# Patient Record
Sex: Male | Born: 1990 | Race: White | Hispanic: No | Marital: Married | Smoking: Never smoker
Health system: Southern US, Community
[De-identification: ages and names within clinical notes are randomized; demographics above are authoritative.]

## PROBLEM LIST (undated history)

## (undated) HISTORY — PX: NEPHRECTOMY TRANSPLANTED ORGAN: SUR880

---
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
## 2008-02-02 ENCOUNTER — Other Ambulatory Visit: Payer: Self-pay

## 2008-02-02 ENCOUNTER — Inpatient Hospital Stay: Payer: Self-pay | Admitting: Specialist

## 2008-02-09 ENCOUNTER — Ambulatory Visit: Payer: Self-pay | Admitting: *Deleted

## 2008-02-15 ENCOUNTER — Ambulatory Visit: Payer: Self-pay

## 2008-04-03 ENCOUNTER — Ambulatory Visit: Payer: Self-pay | Admitting: Vascular Surgery

## 2015-02-09 ENCOUNTER — Emergency Department: Payer: Worker's Compensation

## 2015-02-09 ENCOUNTER — Emergency Department
Admission: EM | Admit: 2015-02-09 | Discharge: 2015-02-09 | Disposition: A | Discharge status: Home / Self Care | Payer: Worker's Compensation | Attending: Emergency Medicine | Admitting: Emergency Medicine

## 2015-02-09 ENCOUNTER — Encounter: Payer: Self-pay | Admitting: *Deleted

## 2015-02-09 DIAGNOSIS — Y9389 Activity, other specified: Secondary | ICD-10-CM | POA: Insufficient documentation

## 2015-02-09 DIAGNOSIS — Y990 Civilian activity done for income or pay: Secondary | ICD-10-CM | POA: Insufficient documentation

## 2015-02-09 DIAGNOSIS — Y9289 Other specified places as the place of occurrence of the external cause: Secondary | ICD-10-CM | POA: Diagnosis not present

## 2015-02-09 DIAGNOSIS — S0990XA Unspecified injury of head, initial encounter: Secondary | ICD-10-CM

## 2015-02-09 MED ORDER — TRAMADOL HCL 50 MG PO TABS: 50.0000 mg | ORAL_TABLET | Freq: Four times a day (QID) | ORAL | Status: DC | PRN
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
## 2015-10-16 ENCOUNTER — Encounter: Payer: Self-pay | Admitting: Physician Assistant

## 2015-10-16 ENCOUNTER — Ambulatory Visit: Payer: Self-pay | Admitting: Physician Assistant

## 2015-10-16 VITALS — BP 120/60 | HR 98 | Temp 98.3°F

## 2015-10-16 DIAGNOSIS — J018 Other acute sinusitis: Secondary | ICD-10-CM

## 2015-10-16 MED ORDER — AMOXICILLIN 875 MG PO TABS: 875.0000 mg | ORAL_TABLET | Freq: Two times a day (BID) | ORAL | Status: DC

## 2015-10-16 NOTE — Progress Notes (Signed)
S: C/o runny nose and congestion for 3 days, no fever, chills, cp/sob, v/d; mucus is green and thick, cough is sporadic, c/o of facial and dental pain. Some ear pain, using xyzal and nasonex  Using otc meds:   O: PE: vitals wnl, nad, perrl eomi, normocephalic, tms dull, nasal mucosa red and swollen, throat injected, neck supple no lymph, lungs c t a, cv rrr, neuro intact  A:  Acute sinusitis   P: amoxil 875mg  bid, drink fluids, continue regular meds , use otc meds of choice, return if not improving in 5 days, return earlier if worsening
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
## 2015-12-14 ENCOUNTER — Other Ambulatory Visit: Payer: Self-pay

## 2015-12-14 ENCOUNTER — Other Ambulatory Visit: Payer: Self-pay | Admitting: Physician Assistant

## 2015-12-14 NOTE — Progress Notes (Signed)
Patient came in to have blood drawn for testing per Dr. Forbes CellarSanoff at [facility].  Due to special handling of two blood orders I had to send the patient to [facility] to get his blood drawn.  I gave the patient a requisition with our account number on it and instructed him to go to [facility] on [address]  I expressed to the patient that I will send the lab results to Dr. Forbes CellarSanoff as soon as I receive the results.

## 2015-12-18 LAB — CBC WITH DIFFERENTIAL/PLATELET
BASOS ABS: 0 10*3/uL (ref 0.0–0.2)
BASOS: 0 %
EOS (ABSOLUTE): 0 10*3/uL (ref 0.0–0.4)
Eos: 1 %
HEMOGLOBIN: 14.1 g/dL (ref 12.6–17.7)
Hematocrit: 41.4 % (ref 37.5–51.0)
IMMATURE GRANS (ABS): 0 10*3/uL (ref 0.0–0.1)
IMMATURE GRANULOCYTES: 0 %
LYMPHS: 22 %
Lymphocytes Absolute: 1.8 10*3/uL (ref 0.7–3.1)
MCH: 29.1 pg (ref 26.6–33.0)
MCHC: 34.1 g/dL (ref 31.5–35.7)
MCV: 85 fL (ref 79–97)
MONOCYTES: 9 %
Monocytes Absolute: 0.8 10*3/uL (ref 0.1–0.9)
NEUTROS ABS: 5.6 10*3/uL (ref 1.4–7.0)
NEUTROS PCT: 68 %
Platelets: 194 10*3/uL (ref 150–379)
RBC: 4.85 x10E6/uL (ref 4.14–5.80)
RDW: 13.4 % (ref 12.3–15.4)
WBC: 8.2 10*3/uL (ref 3.4–10.8)

## 2015-12-18 LAB — URINALYSIS, ROUTINE W REFLEX MICROSCOPIC
Bilirubin, UA: NEGATIVE
GLUCOSE, UA: NEGATIVE
KETONES UA: NEGATIVE
Leukocytes, UA: NEGATIVE
NITRITE UA: NEGATIVE
Protein, UA: NEGATIVE
RBC UA: NEGATIVE
SPEC GRAV UA: 1.015 (ref 1.005–1.030)
UUROB: 0.2 mg/dL (ref 0.2–1.0)
pH, UA: 6 (ref 5.0–7.5)

## 2015-12-18 LAB — BK QUANT PCR (PLASMA/SERUM): BK Quantitaion PCR: NEGATIVE copies/mL

## 2015-12-18 LAB — BASIC METABOLIC PANEL
BUN/Creatinine Ratio: 11 (ref 9–20)
BUN: 16 mg/dL (ref 6–20)
CALCIUM: 9.6 mg/dL (ref 8.7–10.2)
CHLORIDE: 102 mmol/L (ref 96–106)
CO2: 22 mmol/L (ref 18–29)
Creatinine, Ser: 1.44 mg/dL — ABNORMAL HIGH (ref 0.76–1.27)
GFR calc Af Amer: 76 mL/min/{1.73_m2} (ref >59)
GFR, EST NON AFRICAN AMERICAN: 66 mL/min/{1.73_m2} (ref >59)
GLUCOSE: 77 mg/dL (ref 65–99)
POTASSIUM: 4.6 mmol/L (ref 3.5–5.2)
SODIUM: 142 mmol/L (ref 134–144)

## 2015-12-18 LAB — URINE CULTURE: Organism ID, Bacteria: NO GROWTH

## 2015-12-18 LAB — PROTEIN / CREATININE RATIO, URINE
CREATININE, UR: 107 mg/dL
PROTEIN UR: 5.7 mg/dL
PROTEIN/CREAT RATIO: 53 mg/g{creat} (ref 0–200)

## 2015-12-18 LAB — MAGNESIUM: MAGNESIUM: 1.8 mg/dL (ref 1.6–2.3)

## 2015-12-18 LAB — CMV DNA, QUANTITATIVE, PCR: CMV DNA Quant: NEGATIVE IU/mL

## 2015-12-18 LAB — TACROLIMUS LEVEL: TACROLIMUS LVL: 1.5 ng/mL — AB (ref 2.0–20.0)

## 2015-12-18 NOTE — Progress Notes (Signed)
Patient ID: Marco Palmer, male   DOB: 11/18/1990, 25 y.o.   MRN: [id number] Lab results were faxed to Dr. Forbes CellarSanoff at [facility] per patient's request.

## 2016-03-13 DIAGNOSIS — I10 Essential (primary) hypertension: Secondary | ICD-10-CM | POA: Insufficient documentation

## 2016-03-13 DIAGNOSIS — Z940 Kidney transplant status: Secondary | ICD-10-CM | POA: Insufficient documentation

## 2016-03-13 DIAGNOSIS — D849 Immunodeficiency, unspecified: Secondary | ICD-10-CM | POA: Insufficient documentation

## 2016-03-13 DIAGNOSIS — D899 Disorder involving the immune mechanism, unspecified: Secondary | ICD-10-CM

## 2016-06-04 ENCOUNTER — Encounter: Payer: Self-pay | Admitting: Physician Assistant

## 2016-06-04 ENCOUNTER — Ambulatory Visit: Payer: Self-pay | Admitting: Physician Assistant

## 2016-06-04 VITALS — BP 120/60 | HR 98 | Temp 98.2°F

## 2016-06-04 DIAGNOSIS — J069 Acute upper respiratory infection, unspecified: Secondary | ICD-10-CM

## 2016-06-04 MED ORDER — HYDROCOD POLST-CPM POLST ER 10-8 MG/5ML PO SUER: 5.0000 mL | Freq: Two times a day (BID) | ORAL | 0 refills | Status: DC | PRN

## 2016-06-04 MED ORDER — AMOXICILLIN 875 MG PO TABS: 875.0000 mg | ORAL_TABLET | Freq: Two times a day (BID) | ORAL | 0 refills | Status: DC

## 2016-06-04 NOTE — Progress Notes (Signed)
S: C/o cough and congestion for 3 days, no fever, chills, or body aches; denies cp/sob, v/d; mucus is green and thick, cough is sporadic, c/o of facial and dental pain.   Using otc meds:   O: PE: vitals wnl, nad, perrl eomi, normocephalic, tms dull, nasal mucosa red and swollen, throat injected, neck supple no lymph, lungs c t a, cv rrr, neuro intact  A:  Acute uri   P: drink fluids, continue regular meds , use otc meds of choice, return if not improving in 5 days, return earlier if worsening , amoxil, tussionex 150 ml nr

## 2016-07-07 ENCOUNTER — Ambulatory Visit: Payer: Self-pay | Admitting: Physician Assistant

## 2016-07-07 ENCOUNTER — Encounter: Payer: Self-pay | Admitting: Physician Assistant

## 2016-07-07 VITALS — BP 164/88 | HR 96 | Temp 98.3°F

## 2016-07-07 DIAGNOSIS — M62838 Other muscle spasm: Secondary | ICD-10-CM

## 2016-07-07 MED ORDER — CYCLOBENZAPRINE HCL 10 MG PO TABS: 10.0000 mg | ORAL_TABLET | Freq: Three times a day (TID) | ORAL | 0 refills | Status: DC | PRN

## 2016-07-07 NOTE — Progress Notes (Signed)
S: has been doing a lot of coughing, lower back is hurting and he thinks its from straining with the cough, no fever/chills, no cp/sob, states it hurts more with movement, can feel a knot in his back, denies numbness or tingling  O: vitals wnl, nad, lungs c ta ,cv rrr, lower back with spasms along left si to l3, n/v intact  A: acute back pain secondary to muscle spasm  P: flexeril 10mg 

## 2016-07-18 ENCOUNTER — Other Ambulatory Visit: Payer: Self-pay

## 2016-07-18 NOTE — Progress Notes (Signed)
Patient came in to have blood drawn for testing per Dr. Forbes CellarSanoff from [facility]'s orders.  Due to the special handling of the specimen I couldn't collect his blood here in the office.  I sent the patient to [facility].  Results will be faxed to dr. Forbes CellarSanoff when they are finalized.

## 2016-07-22 LAB — URINALYSIS, ROUTINE W REFLEX MICROSCOPIC
Bilirubin, UA: NEGATIVE
Glucose, UA: NEGATIVE
Ketones, UA: NEGATIVE
Leukocytes, UA: NEGATIVE
NITRITE UA: NEGATIVE
PH UA: 5.5 (ref 5.0–7.5)
Protein, UA: NEGATIVE
RBC, UA: NEGATIVE
Specific Gravity, UA: 1.015 (ref 1.005–1.030)
UUROB: 0.2 mg/dL (ref 0.2–1.0)

## 2016-07-22 LAB — BASIC METABOLIC PANEL
BUN/Creatinine Ratio: 13 (ref 9–20)
BUN: 16 mg/dL (ref 6–20)
CALCIUM: 9.2 mg/dL (ref 8.7–10.2)
CHLORIDE: 100 mmol/L (ref 96–106)
CO2: 23 mmol/L (ref 18–29)
Creatinine, Ser: 1.22 mg/dL (ref 0.76–1.27)
GFR calc Af Amer: 93 mL/min/{1.73_m2} (ref >59)
GFR calc non Af Amer: 80 mL/min/{1.73_m2} (ref >59)
GLUCOSE: 91 mg/dL (ref 65–99)
Potassium: 4.9 mmol/L (ref 3.5–5.2)
Sodium: 139 mmol/L (ref 134–144)

## 2016-07-22 LAB — MAGNESIUM: Magnesium: 1.7 mg/dL (ref 1.6–2.3)

## 2016-07-22 LAB — CBC WITH DIFFERENTIAL/PLATELET
BASOS: 0 %
Basophils Absolute: 0 10*3/uL (ref 0.0–0.2)
EOS (ABSOLUTE): 0.1 10*3/uL (ref 0.0–0.4)
EOS: 1 %
HEMATOCRIT: 42.2 % (ref 37.5–51.0)
Hemoglobin: 13.5 g/dL (ref 13.0–17.7)
IMMATURE GRANS (ABS): 0 10*3/uL (ref 0.0–0.1)
IMMATURE GRANULOCYTES: 0 %
LYMPHS: 22 %
Lymphocytes Absolute: 2.2 10*3/uL (ref 0.7–3.1)
MCH: 28.2 pg (ref 26.6–33.0)
MCHC: 32 g/dL (ref 31.5–35.7)
MCV: 88 fL (ref 79–97)
MONOS ABS: 1.1 10*3/uL — AB (ref 0.1–0.9)
Monocytes: 11 %
NEUTROS ABS: 6.3 10*3/uL (ref 1.4–7.0)
NEUTROS PCT: 66 %
Platelets: 250 10*3/uL (ref 150–379)
RBC: 4.79 x10E6/uL (ref 4.14–5.80)
RDW: 13.4 % (ref 12.3–15.4)
WBC: 9.9 10*3/uL (ref 3.4–10.8)

## 2016-07-22 LAB — CMV DNA, QUANTITATIVE, PCR: CMV DNA Quant: NEGATIVE IU/mL

## 2016-07-22 LAB — PROTEIN / CREATININE RATIO, URINE
CREATININE, UR: 71.6 mg/dL
PROTEIN UR: 5.6 mg/dL
PROTEIN/CREAT RATIO: 78 mg/g{creat} (ref 0–200)

## 2016-07-22 LAB — BK QUANT PCR (PLASMA/SERUM): BK Quantitaion PCR: NEGATIVE copies/mL

## 2016-07-22 LAB — TACROLIMUS LEVEL: TACROLIMUS LVL: 5.8 ng/mL (ref 2.0–20.0)

## 2016-09-23 ENCOUNTER — Ambulatory Visit: Payer: Self-pay | Admitting: Physician Assistant

## 2016-09-24 ENCOUNTER — Ambulatory Visit: Payer: Self-pay | Admitting: Physician Assistant

## 2016-09-24 ENCOUNTER — Encounter: Payer: Self-pay | Admitting: Physician Assistant

## 2016-09-24 VITALS — BP 130/70 | HR 94 | Temp 98.5°F | Ht 68.0 in | Wt 276.0 lb

## 2016-09-24 DIAGNOSIS — Z0189 Encounter for other specified special examinations: Secondary | ICD-10-CM

## 2016-09-24 DIAGNOSIS — Z0000 Encounter for general adult medical examination without abnormal findings: Secondary | ICD-10-CM

## 2016-09-24 DIAGNOSIS — Z008 Encounter for other general examination: Secondary | ICD-10-CM

## 2016-09-24 NOTE — Progress Notes (Signed)
S: pt here for wellness physical and biometrics for insurance purposes, no complaints ros neg. PMH:   Kidney transplant, wegeners, htn  Social: nonsmoker, no etoh  Fam: negative; parents and siblings are healthy, grandparents are healthy  O: vitals wnl, nad, ENT wnl, neck supple no lymph, lungs c t a, cv rrr, abd soft nontender bs normal all 4 quads  A: wellness, biometric physical  P:  F/u prn

## 2017-01-21 ENCOUNTER — Other Ambulatory Visit: Payer: Self-pay

## 2017-01-21 ENCOUNTER — Encounter (INDEPENDENT_AMBULATORY_CARE_PROVIDER_SITE_OTHER): Payer: Self-pay

## 2017-01-21 DIAGNOSIS — Z940 Kidney transplant status: Secondary | ICD-10-CM

## 2017-01-21 NOTE — Progress Notes (Signed)
Patient came in to have blood drawn for testing per Dr. Lorin Picket Sanoff''s orders.  This physician is from [facility].

## 2017-01-23 LAB — CBC WITH DIFFERENTIAL/PLATELET
BASOS: 0 %
Basophils Absolute: 0 10*3/uL (ref 0.0–0.2)
EOS (ABSOLUTE): 0.1 10*3/uL (ref 0.0–0.4)
Eos: 1 %
Hematocrit: 40.3 % (ref 37.5–51.0)
Hemoglobin: 13 g/dL (ref 13.0–17.7)
IMMATURE GRANS (ABS): 0 10*3/uL (ref 0.0–0.1)
IMMATURE GRANULOCYTES: 0 %
LYMPHS: 25 %
Lymphocytes Absolute: 2.1 10*3/uL (ref 0.7–3.1)
MCH: 29.1 pg (ref 26.6–33.0)
MCHC: 32.3 g/dL (ref 31.5–35.7)
MCV: 90 fL (ref 79–97)
Monocytes Absolute: 0.7 10*3/uL (ref 0.1–0.9)
Monocytes: 9 %
NEUTROS PCT: 65 %
Neutrophils Absolute: 5.4 10*3/uL (ref 1.4–7.0)
Platelets: 199 10*3/uL (ref 150–379)
RBC: 4.47 x10E6/uL (ref 4.14–5.80)
RDW: 13.5 % (ref 12.3–15.4)
WBC: 8.4 10*3/uL (ref 3.4–10.8)

## 2017-01-23 LAB — BASIC METABOLIC PANEL
BUN/Creatinine Ratio: 12 (ref 9–20)
BUN: 18 mg/dL (ref 6–20)
CALCIUM: 9.3 mg/dL (ref 8.7–10.2)
CHLORIDE: 105 mmol/L (ref 96–106)
CO2: 21 mmol/L (ref 20–29)
Creatinine, Ser: 1.45 mg/dL — ABNORMAL HIGH (ref 0.76–1.27)
GFR calc non Af Amer: 65 mL/min/{1.73_m2} (ref >59)
GFR, EST AFRICAN AMERICAN: 75 mL/min/{1.73_m2} (ref >59)
Glucose: 93 mg/dL (ref 65–99)
POTASSIUM: 5 mmol/L (ref 3.5–5.2)
Sodium: 142 mmol/L (ref 134–144)

## 2017-01-23 LAB — TACROLIMUS LEVEL: TACROLIMUS LVL: 6.4 ng/mL (ref 2.0–20.0)

## 2017-01-29 LAB — URINALYSIS, ROUTINE W REFLEX MICROSCOPIC
BILIRUBIN UA: NEGATIVE
Glucose, UA: NEGATIVE
Ketones, UA: NEGATIVE
LEUKOCYTES UA: NEGATIVE
Nitrite, UA: NEGATIVE
PH UA: 5 (ref 5.0–7.5)
PROTEIN UA: NEGATIVE
RBC, UA: NEGATIVE
Specific Gravity, UA: 1.017 (ref 1.005–1.030)
Urobilinogen, Ur: 0.2 mg/dL (ref 0.2–1.0)

## 2017-01-29 LAB — PROTEIN / CREATININE RATIO, URINE
Creatinine, Urine: 108.8 mg/dL
Protein, Ur: 7 mg/dL
Protein/Creat Ratio: 64 mg/g creat (ref 0–200)

## 2017-01-29 LAB — URINE CULTURE

## 2017-01-30 ENCOUNTER — Other Ambulatory Visit: Payer: Self-pay

## 2017-01-30 DIAGNOSIS — Z299 Encounter for prophylactic measures, unspecified: Secondary | ICD-10-CM

## 2017-01-30 NOTE — Progress Notes (Signed)
Patient came in to have his urine recollected.

## 2017-02-01 LAB — URINE CULTURE: Organism ID, Bacteria: NO GROWTH

## 2017-02-03 NOTE — Progress Notes (Signed)
Thank you we sent that in error. sorry

## 2017-05-01 ENCOUNTER — Ambulatory Visit: Payer: Self-pay | Admitting: Emergency Medicine

## 2017-05-01 ENCOUNTER — Encounter: Payer: Self-pay | Admitting: Emergency Medicine

## 2017-05-01 VITALS — BP 130/70 | HR 108 | Temp 98.2°F | Resp 16

## 2017-05-01 DIAGNOSIS — J0101 Acute recurrent maxillary sinusitis: Secondary | ICD-10-CM

## 2017-05-01 MED ORDER — AMOXICILLIN 875 MG PO TABS: 875.0000 mg | ORAL_TABLET | Freq: Two times a day (BID) | ORAL | 0 refills | Status: DC

## 2017-05-01 NOTE — Progress Notes (Signed)
Subjective.  Patient presents with onset Sunday of sinus pressure headache and fever up to 100 he has been taking Xyzal and a steroid nasal spray as well as using saline irrigations without improvement. He feels very full in his facial area. He occasionally has a periodic productive cough. Review of systems. Patient carries a diagnosis of Wegener's granulomatosis. He is currently immunosuppressed and on immunosuppressive medications [facility]. He typically tries amoxicillin for 10 days and then if he does not improve he sees his rheumatologist. Objective. Alert and cooperative in no distress. TMs clear. Nose congested. Throat slightly red. Chest clear to auscultation and percussion. Assessment. Upper respiratory  infection with sinusitis in an immunocompromised individual with diagnosis of Wegener's. Plan. Amoxicillin 875 twice a day. Follow-up with rheumatologist is symptoms persist.

## 2017-06-16 ENCOUNTER — Ambulatory Visit: Payer: Self-pay | Admitting: Family Medicine

## 2017-06-16 ENCOUNTER — Encounter: Payer: Self-pay | Admitting: Family Medicine

## 2017-06-16 VITALS — BP 128/76 | HR 91 | Temp 98.1°F | Resp 18 | Ht 67.0 in | Wt 278.0 lb

## 2017-06-16 DIAGNOSIS — Z299 Encounter for prophylactic measures, unspecified: Secondary | ICD-10-CM

## 2017-06-16 NOTE — Progress Notes (Signed)
   Subjective: Annual biometric screening     Patient ID: Marco Palmer, male    DOB: 07/10/1990, 27 y.o.   MRN: [id number]  HPI Patient presents today for annual biometric screening. Denies any complaints or concerns. 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  ? Objective:   Review of Systems Constitutional: No weight change, no fever, no chills, no night sweats, no fatigue, no malaise.  Musculoskeletal: No arthralgias, myalgias, joint swelling, joint stiffness, back pain, neck pain. ROS otherwise negative.     Objective:   Physical Exam General: Awake, alert and oriented. No acute distress. Well developed, hydrated and nourished. Appears stated age.  HEENT: PERRLA, EOM intact, supple neck without adenopathy. Sclera is non-icteric. The ear canal is clear without discharge. The tympanic membrane is normal in appearance with normal landmarks and cone of light. Nasal mucosa is pink and moist. Oral mucosa is pink and moist with good dentition. The pharynx is normal in appearance without tonsillar swelling or exudates.  Skin: Skin in warm, dry and intact without rashes or lesions. Appropriate color for ethnicity. Nailbeds pink with no cyanosis or clubbing. Cardiac: No lifts, heaves, or thrills. PMI is not visible and is palpated in the 5th intercostal space at the midclavicular line. Heart rate and rhythm are normal. No extra heart sounds or rubs are auscultated.  Respiratory: The chest wall is symmetric and without deformity. No signs of respiratory distress. Lung sounds are clear in all lobes bilaterally without rales, ronchi, or wheezes.  Abdominal: Obese. Abdomen is soft and non-tender without distention. Visible scar to right abdomen from kidney transplant. No tenderness. No masses, hepatomegaly, or splenomegaly are noted.  Spine: Neck and back are without deformity, external skin changes, or signs of trauma. Curvature of the cervical, thoracic, and lumbar spine are within  normal limits. Bony features of the shoulders and hips are of equal height bilaterally. Posture is upright, gait is smooth, steady, and within normal limits.  No tenderness noted on palpation of the spinous processes. Spinous processes are midline. Cervical, thoracic, and lumbar paraspinal muscles are not tender and are without spasm. No discomfort is noted with flexion, extension, and side-to-side rotation of the cervical spine, full range of motion is noted. Full range of motion including flexion, extension, and side-to-side rotation of the thoracic and lumbar spine are noted and without discomfort. Extremities: Upper and lower extremities are atraumatic in appearance without tenderness or deformity. No swelling or erythema. Full range of motion is noted in the major joints. Steady gait noted.  Neurological: The patient is awake, alert and oriented to person, place, and time with normal speech. Motor function is normal with muscle strength 5/5 bilaterally to upper and lower extremities. Memory is normal and thought process is intact. No gait abnormalities are appreciated.  Psychiatric: Appropriate mood and affect.    Assessment & Plan:  Annual Biometric Screening  Routine labs ordered.  Follow up PRN with primary care provider.

## 2017-06-17 LAB — CMP12+LP+TP+TSH+6AC+PSA+CBC…
ALBUMIN: 4.5 g/dL (ref 3.5–5.5)
ALT: 18 IU/L (ref 0–44)
AST: 17 IU/L (ref 0–40)
Albumin/Globulin Ratio: 2 (ref 1.2–2.2)
Alkaline Phosphatase: 61 IU/L (ref 39–117)
BASOS ABS: 0 10*3/uL (ref 0.0–0.2)
BUN / CREAT RATIO: 14 (ref 9–20)
BUN: 17 mg/dL (ref 6–20)
Basos: 0 %
Bilirubin Total: 0.9 mg/dL (ref 0.0–1.2)
CALCIUM: 9.4 mg/dL (ref 8.7–10.2)
CHLORIDE: 104 mmol/L (ref 96–106)
CHOLESTEROL TOTAL: 185 mg/dL (ref 100–199)
Chol/HDL Ratio: 3.7 ratio (ref 0.0–5.0)
Creatinine, Ser: 1.2 mg/dL (ref 0.76–1.27)
EOS (ABSOLUTE): 0.1 10*3/uL (ref 0.0–0.4)
EOS: 1 %
ESTIMATED CHD RISK: 0.6 times avg. (ref 0.0–1.0)
Free Thyroxine Index: 1.6 (ref 1.2–4.9)
GFR calc Af Amer: 94 mL/min/{1.73_m2} (ref >59)
GFR calc non Af Amer: 81 mL/min/{1.73_m2} (ref >59)
GGT: 39 IU/L (ref 0–65)
Globulin, Total: 2.3 g/dL (ref 1.5–4.5)
Glucose: 93 mg/dL (ref 65–99)
HDL: 50 mg/dL (ref >39)
Hematocrit: 42.6 % (ref 37.5–51.0)
Hemoglobin: 13.5 g/dL (ref 13.0–17.7)
IRON: 86 ug/dL (ref 38–169)
Immature Grans (Abs): 0 10*3/uL (ref 0.0–0.1)
Immature Granulocytes: 0 %
LDH: 199 IU/L (ref 121–224)
LDL Calculated: 117 mg/dL — ABNORMAL HIGH (ref 0–99)
LYMPHS ABS: 2.7 10*3/uL (ref 0.7–3.1)
Lymphs: 23 %
MCH: 28.4 pg (ref 26.6–33.0)
MCHC: 31.7 g/dL (ref 31.5–35.7)
MCV: 90 fL (ref 79–97)
Monocytes Absolute: 0.8 10*3/uL (ref 0.1–0.9)
Monocytes: 7 %
NEUTROS ABS: 8.3 10*3/uL — AB (ref 1.4–7.0)
Neutrophils: 69 %
POTASSIUM: 4.7 mmol/L (ref 3.5–5.2)
Phosphorus: 3.7 mg/dL (ref 2.5–4.5)
Platelets: 234 10*3/uL (ref 150–379)
Prostate Specific Ag, Serum: 0.6 ng/mL (ref 0.0–4.0)
RBC: 4.75 x10E6/uL (ref 4.14–5.80)
RDW: 14 % (ref 12.3–15.4)
Sodium: 141 mmol/L (ref 134–144)
T3 UPTAKE RATIO: 25 % (ref 24–39)
T4, Total: 6.4 ug/dL (ref 4.5–12.0)
TOTAL PROTEIN: 6.8 g/dL (ref 6.0–8.5)
TSH: 2.81 u[IU]/mL (ref 0.450–4.500)
Triglycerides: 89 mg/dL (ref 0–149)
Uric Acid: 7.2 mg/dL (ref 3.7–8.6)
VLDL Cholesterol Cal: 18 mg/dL (ref 5–40)
WBC: 11.8 10*3/uL — ABNORMAL HIGH (ref 3.4–10.8)

## 2017-06-17 LAB — VITAMIN B12: VITAMIN B 12: 479 pg/mL (ref 232–1245)

## 2017-06-29 NOTE — Progress Notes (Signed)
If patient calls back please inform him that his "bad cholesterol" (LDL) is slightly elevated at 117, normal range is 0-99. Tell him to f/u with his PCP regarding this. His WBC count was very slightly elevated, which may be insignificant, but should be repeated at his PCP's office to see if this has normalized or if he needs additional testing. Tell him to see his PCP within the next month to follow up on both of these results. He should see his PCP sooner if he has any concerns, symptoms, or signs of infection.

## 2017-07-28 ENCOUNTER — Ambulatory Visit: Payer: Self-pay | Admitting: Family Medicine

## 2017-07-28 ENCOUNTER — Encounter: Payer: Self-pay | Admitting: Family Medicine

## 2017-07-28 VITALS — BP 139/73 | HR 97 | Temp 98.3°F | Resp 20

## 2017-07-28 DIAGNOSIS — J329 Chronic sinusitis, unspecified: Secondary | ICD-10-CM

## 2017-07-28 DIAGNOSIS — J309 Allergic rhinitis, unspecified: Secondary | ICD-10-CM

## 2017-07-28 MED ORDER — FLUTICASONE PROPIONATE 50 MCG/ACT NA SUSP: 2.0000 | Freq: Every day | NASAL | 1 refills | Status: AC

## 2017-07-28 MED ORDER — AMOXICILLIN-POT CLAVULANATE 875-125 MG PO TABS: 1.0000 | ORAL_TABLET | Freq: Two times a day (BID) | ORAL | 0 refills | Status: AC

## 2017-07-28 NOTE — Progress Notes (Signed)
Subjective: Congestion     Marco Palmer is a 27 y.o. male who presents for evaluation of nasal congestion, rhinorrhea with clear nasal discharge, facial pressure, pressure behind his eyes bilaterally, cough with green sputum and sore throat for 3 days.  Patient has a history of Wegener's granulomatosis, which he takes prednisone daily for and sees rheumatology.  Patient also has a history of a renal transplant and is on tacrolimus.  Patient reports multiple sinus infections each year and a history of allergic rhinitis.  Patient has taken intranasal steroids in the past but is not taking this currently because he said it was not authorized by his insurance as approved.  Patient is currently taking Xyzal for symptoms.  Patient presents today because he is concerned that he may have a sinus infection and is concerned because of his immunosuppression.  Patient reports symptoms began after he was working outside.  Allergic rhinitis symptoms coincide with seasonal changes.  Denies rash, nausea, vomiting, diarrhea, shortness of breath, wheezing, chest or back pain, ear pain, difficulty swallowing, confusion, purulent nasal discharge, headache, body aches, fever, chills, fatigue, malaise, severe symptoms, or initial improvement and then worsening of symptoms.  Denies any other symptoms. History of smoking, asthma, COPD: Negative. History of recurrent sinus and/or lung infections: Positive for recurrent sinus infections.  Negative for recurrent lung infections. Antibiotic use in the last 3 months: Amoxicillin 3 months ago.   Review of Systems Pertinent items noted in HPI and remainder of comprehensive ROS otherwise negative.     Objective:   Physical Exam General: Awake, alert, and oriented. No acute distress. Well developed, hydrated and nourished. Appears stated age. Nontoxic appearance.  Afebrile. HEENT: No PND noted.  Mild erythema to posterior oropharynx.  No edema or exudates of pharynx or tonsils. No  erythema or bulging of TM.  Mild erythema/edema to nasal mucosa. Sinuses nontender. Supple neck without adenopathy. Cardiac: Heart rate and rhythm are normal. No extra heart sounds or rubs are auscultated.  Respiratory: No signs of respiratory distress. Lungs clear. No tachypnea. Able to speak in full sentences without dyspnea. Nonlabored respirations.  Skin: Skin is warm, dry and intact. Appropriate color for ethnicity. No cyanosis noted.   Diagnostic Results: None.  Assessment:    allergic rhinitis and sinusitis   Plan:    Discussed the diagnosis and treatment of sinusitis. Discussed the importance of avoiding unnecessary antibiotic therapy. Suggested symptomatic OTC remedies. Nasal saline spray for congestion. Nasal steroids per orders.  Discussed the most effective way to use Flonase and the availability over-the-counter if unable to get it by prescription.. Delayed antibiotic prescription for Augmentin provided.  Wanted patient to have this readily available to him due to his immunosuppression.  Patient has taken this in the past and tolerated this well.  Advised patient only to fill this if he has no improvement in symptoms by day 10 of illness or if symptoms worsen. Follow-up with primary care provider.  Discussed red flag symptoms and circumstances with which to seek medical care.   New Prescriptions   AMOXICILLIN-CLAVULANATE (AUGMENTIN) 875-125 MG TABLET    Take 1 tablet by mouth 2 (two) times daily for 10 days.   FLUTICASONE (FLONASE) 50 MCG/ACT NASAL SPRAY    Place 2 sprays into both nostrils daily.

## 2017-10-15 DIAGNOSIS — T829XXS Unspecified complication of cardiac and vascular prosthetic device, implant and graft, sequela: Secondary | ICD-10-CM | POA: Insufficient documentation

## 2018-12-23 ENCOUNTER — Ambulatory Visit: Payer: Managed Care, Other (non HMO) | Admitting: Adult Health

## 2018-12-23 ENCOUNTER — Other Ambulatory Visit: Payer: Self-pay

## 2018-12-23 ENCOUNTER — Encounter: Payer: Self-pay | Admitting: Adult Health

## 2018-12-23 VITALS — BP 118/70 | HR 78 | Temp 97.8°F | Resp 16 | Ht 68.0 in | Wt 258.0 lb

## 2018-12-23 DIAGNOSIS — Z008 Encounter for other general examination: Secondary | ICD-10-CM

## 2018-12-23 DIAGNOSIS — Z0189 Encounter for other specified special examinations: Secondary | ICD-10-CM | POA: Diagnosis not present

## 2018-12-23 DIAGNOSIS — Z23 Encounter for immunization: Secondary | ICD-10-CM | POA: Diagnosis not present

## 2018-12-23 DIAGNOSIS — D899 Disorder involving the immune mechanism, unspecified: Secondary | ICD-10-CM | POA: Diagnosis not present

## 2018-12-23 DIAGNOSIS — D849 Immunodeficiency, unspecified: Secondary | ICD-10-CM

## 2018-12-23 MED ORDER — TETANUS-DIPHTHERIA TOXOIDS TD 5-2 LFU IM INJ: 0.5000 mL | INJECTION | Freq: Once | INTRAMUSCULAR | Status: DC

## 2018-12-23 NOTE — Progress Notes (Signed)
[place] DOB: 28 y.o. MRN: [id number]  Subjective:  Here for Biometric Screen/brief exam Patient is a 27 year old male in no acute distress who comes to the clinic for a biometric screening and brief biometric exam. He does have a primary care provider who he sees regularly. He does request a Tdap vaccine today he reports he has not had one in over 10 years and he works with [employer] and is constantly getting scrapes and cuts. He denies any current wounds at this time.  He does have a history of having a kidney transplant his father was his kidney donor, he reports he has done well with this and has no issues, his last kidney function was normal and he has regular follow-ups Patient  denies any fever, body aches,chills, rash, chest pain, shortness of breath, nausea, vomiting, or diarrhea.     Patient Active Problem List   Diagnosis Date Noted  . 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 Essential hypertension 03/13/2016  . Immunosuppression ([place]) 03/13/2016    Current Outpatient Medications:  .  amLODipine (NORVASC) 10 MG tablet, TAKE ONE TABLET EVERY DAY, Disp: , Rfl:  .  levocetirizine (XYZAL) 5 MG tablet, TAKE 1 TABLET BY MOUTH EVERY EVENING, Disp: , Rfl:  .  lisinopril (ZESTRIL) 10 MG tablet, TAKE ONE TABLET BY MOUTH EVERY DAY, Disp: , Rfl:  .  mycophenolate (CELLCEPT) 250 MG capsule, Take by mouth., Disp: , Rfl:  .  tacrolimus (PROGRAF) 0.5 MG capsule, Take by mouth., Disp: , Rfl:  .  fluticasone (FLONASE) 50 MCG/ACT nasal spray, Place 2 sprays into both nostrils daily., Disp: 16 g, Rfl: 1  Objective: Blood pressure 118/70, pulse 78, temperature 97.8 F (36.6 C), temperature source Temporal, resp. rate 16, height 5\' 8"  (1.727 m), weight 258 lb (117 kg), SpO2 97 %. NAD, well-developed well-nourished HEENT: Within normal limits, no cervical lymphadenopathy, Neck: Normal, supple normal range  of motion Heart: Regular rate and rhythm Lungs: Clear to auscultation without any adventitious lung sounds.  Assessment: Biometric screen    ICD-10-CM   1. Encounter for other general examination-brief biometric screening and brief exam, this is not a full annual physical.  Z00.8 Glucose, random    Lipid Panel With LDL/HDL Ratio    DISCONTINUED: tetanus & diphtheria toxoids (adult) (TENIVAC) injection 0.5 mL  2. Encounter for biometric screening  Z01.89 Glucose, random    Lipid Panel With LDL/HDL Ratio  3. Need for diphtheria-tetanus-pertussis (Tdap) vaccine  Z23     Plan:  He is advised to keep his regular primary care appointments for routine health maintenance and his specialist appointments as he is advised to do so by them Orders Placed This Encounter  Procedures  . Glucose, random  . Lipid Panel With LDL/HDL Ratio   VIS sheet was given to patient and reviewed. Fasting glucose and lipids. Discussed with patient that today's visit here is a limited biometric screening visit (not a comprehensive exam or management of any chronic problems) Discussed some health issues, including healthy eating habits and exercise. Encouraged to follow-up with PCP for annual comprehensive preventive and wellness care (and if applicable, any chronic issues). Questions invited and answered. Patient verbalized understanding of all instructions given and denies any further questions at this time.

## 2018-12-23 NOTE — Patient Instructions (Signed)
I will have the office call you on your glucose and cholesterol results when they return if you have not heard within 1 week please call the office.  This biometric physical is a brief physical and the only labs done are glucose and your lipid panel(cholesterol) and is  not a substitute for seeing a primary care provider for a complete annual physical. Please see a primary care physician for routine health maintenance, labs and full physical at least yearly and follow up as recommended by your provider. Provider also recommends if you do not have a primary care provider for patient to establish care as soon as possible .Patient may chose provider of choice. Also gave the [place] at [phone] or web site at [email or web address] to help assist with finding a primary care doctor.  Patient verbalizes understanding that his office is acute care only and not a substitute for a primary care or for the management of chronic conditions.    Health Maintenance, Male Adopting a healthy lifestyle and getting preventive care are important in promoting health and wellness. Ask your health care provider about:  The right schedule for you to have regular tests and exams.  Things you can do on your own to prevent diseases and keep yourself healthy. What should I know about diet, weight, and exercise? Eat a healthy diet   Eat a diet that includes plenty of vegetables, fruits, low-fat dairy products, and lean protein.  Do not eat a lot of foods that are high in solid fats, added sugars, or sodium. Maintain a healthy weight Body mass index (BMI) is a measurement that can be used to identify possible weight problems. It estimates body fat based on height and weight. Your health care provider can help determine your BMI and help you achieve or maintain a healthy weight. Get regular exercise Get regular exercise. This is one of the most important things you can do for your  health. Most adults should:  Exercise for at least 150 minutes each week. The exercise should increase your heart rate and make you sweat (moderate-intensity exercise).  Do strengthening exercises at least twice a week. This is in addition to the moderate-intensity exercise.  Spend less time sitting. Even light physical activity can be beneficial. Watch cholesterol and blood lipids Have your blood tested for lipids and cholesterol at 30 years of age, then have this test every 5 years. You may need to have your cholesterol levels checked more often if:  Your lipid or cholesterol levels are high.  You are older than 30 years of age.  You are at high risk for heart disease. What should I know about cancer screening? Many types of cancers can be detected early and may often be prevented. Depending on your health history and family history, you may need to have cancer screening at various ages. This may include screening for:  Colorectal cancer.  Prostate cancer.  Skin cancer.  Lung cancer. What should I know about heart disease, diabetes, and high blood pressure? Blood pressure and heart disease  High blood pressure causes heart disease and increases the risk of stroke. This is more likely to develop in people who have high blood pressure readings, are of African descent, or are overweight.  Talk with your health care provider about your target blood pressure readings.  Have your blood pressure checked: ? Every 3-5 years if you are 66-21 years of age. ? Every year if you are  30 years old or older.  If you are between the ages of 3265 and 8875 and are a current or former smoker, ask your health care provider if you should have a one-time screening for abdominal aortic aneurysm (AAA). Diabetes Have regular diabetes screenings. This checks your fasting blood sugar level. Have the screening done:  Once every three years after age 30 if you are at a normal weight and have a low risk for  diabetes.  More often and at a younger age if you are overweight or have a high risk for diabetes. What should I know about preventing infection? Hepatitis B If you have a higher risk for hepatitis B, you should be screened for this virus. Talk with your health care provider to find out if you are at risk for hepatitis B infection. Hepatitis C Blood testing is recommended for:  Everyone born from [birth year] through 1967.  Anyone with known risk factors for hepatitis C. Sexually transmitted infections (STIs)  You should be screened each year for STIs, including gonorrhea and chlamydia, if: ? You are sexually active and are younger than 30 years of age. ? You are older than 30 years of age and your health care provider tells you that you are at risk for this type of infection. ? Your sexual activity has changed since you were last screened, and you are at increased risk for chlamydia or gonorrhea. Ask your health care provider if you are at risk.  Ask your health care provider about whether you are at high risk for HIV. Your health care provider may recommend a prescription medicine to help prevent HIV infection. If you choose to take medicine to prevent HIV, you should first get tested for HIV. You should then be tested every 3 months for as long as you are taking the medicine. Follow these instructions at home: Lifestyle  Do not use any products that contain nicotine or tobacco, such as cigarettes, e-cigarettes, and chewing tobacco. If you need help quitting, ask your health care provider.  Do not use street drugs.  Do not share needles.  Ask your health care provider for help if you need support or information about quitting drugs. Alcohol use  Do not drink alcohol if your health care provider tells you not to drink.  If you drink alcohol: ? Limit how much you have to 0-2 drinks a day. ? Be aware of how much alcohol is in your drink. In the [place] one drink equals one 12 oz bottle of  beer (355 mL), one 5 oz glass of wine (148 mL), or one 1 oz glass of hard liquor (44 mL). General instructions  Schedule regular health, dental, and eye exams.  Stay current with your vaccines.  Tell your health care provider if: ? You often feel depressed. ? You have ever been abused or do not feel safe at home. Summary  Adopting a healthy lifestyle and getting preventive care are important in promoting health and wellness.  Follow your health care provider's instructions about healthy diet, exercising, and getting tested or screened for diseases.  Follow your health care provider's instructions on monitoring your cholesterol and blood pressure. This information is not intended to replace advice given to you by your health care provider. Make sure you discuss any questions you have with your health care provider. Document Released: 10/11/2007 Document Revised: 04/07/2018 Document Reviewed: 04/07/2018 Elsevier Patient Education  2020 [employer] https://www.cdc.gov/vaccines/hcp/vis/vis-statements/tdap.pdf">  Tdap Vaccine (Tetanus, Diphtheria and Pertussis): What You Need to  Know 1. Why get vaccinated? Tetanus, diphtheria and pertussis are very serious diseases. Tdap vaccine can protect [place] from these diseases. And, Tdap vaccine given to pregnant women can protect newborn babies against pertussis.Marland Kitchen TETANUS (Lockjaw) is rare in the [place] today. It causes painful muscle tightening and stiffness, usually all over the body.  It can lead to tightening of muscles in the head and neck so you can't open your mouth, swallow, or sometimes even breathe. Tetanus kills about 1 out of 10 people who are infected even after receiving the best medical care. DIPHTHERIA is also rare in the [place] today. It can cause a thick coating to form in the back of the throat.  It can lead to breathing problems, heart failure, paralysis, and death. PERTUSSIS (Whooping Cough) causes severe coughing  spells, which can cause difficulty breathing, vomiting and disturbed sleep.  It can also lead to weight loss, incontinence, and rib fractures. Up to 2 in 100 adolescents and 5 in 100 adults with pertussis are hospitalized or have complications, which could include pneumonia or death. These diseases are caused by bacteria. Diphtheria and pertussis are spread from person to person through secretions from coughing or sneezing. Tetanus enters the body through cuts, scratches, or wounds. Before vaccines, as many as 200,000 cases of diphtheria, 200,000 cases of pertussis, and hundreds of cases of tetanus, were reported in the [place] each year. Since vaccination began, reports of cases for tetanus and diphtheria have dropped by about 99% and for pertussis by about 80%. 2. Tdap vaccine Tdap vaccine can protect adolescents and adults from tetanus, diphtheria, and pertussis. One dose of Tdap is routinely given at age 67 or 17. People who did not get Tdap at that age should get it as soon as possible. Tdap is especially important for healthcare professionals and anyone having close contact with a baby younger than 12 months. Pregnant women should get a dose of Tdap during every pregnancy, to protect the newborn from pertussis. Infants are most at risk for severe, life-threatening complications from pertussis. Another vaccine, called Td, protects against tetanus and diphtheria, but not pertussis. A Td booster should be given every 10 years. Tdap may be given as one of these boosters if you have never gotten Tdap before. Tdap may also be given after a severe cut or burn to prevent tetanus infection. Your doctor or the person giving you the vaccine can give you more information. Tdap may safely be given at the same time as other vaccines. 3. Some people should not get this vaccine  A person who has ever had a life-threatening allergic reaction after a previous dose of any diphtheria, tetanus or pertussis  containing vaccine, OR has a severe allergy to any part of this vaccine, should not get Tdap vaccine. Tell the person giving the vaccine about any severe allergies.  Anyone who had coma or long repeated seizures within 7 days after a childhood dose of DTP or DTaP, or a previous dose of Tdap, should not get Tdap, unless a cause other than the vaccine was found. They can still get Td.  Talk to your doctor if you: ? have seizures or another nervous system problem, ? had severe pain or swelling after any vaccine containing diphtheria, tetanus or pertussis, ? ever had a condition called Guillain-Barr Syndrome (GBS), ? aren't feeling well on the day the shot is scheduled. 4. Risks With any medicine, including vaccines, there is a chance of side effects. These are usually mild  and go away on their own. Serious reactions are also possible but are rare. Most people who get Tdap vaccine do not have any problems with it. Mild problems following Tdap (Did not interfere with activities)  Pain where the shot was given (about 3 in 4 adolescents or 2 in 3 adults)  Redness or swelling where the shot was given (about 1 person in 5)  Mild fever of at least 100.54F (up to about 1 in 25 adolescents or 1 in 100 adults)  Headache (about 3 or 4 people in 10)  Tiredness (about 1 person in 3 or 4)  Nausea, vomiting, diarrhea, stomach ache (up to 1 in 4 adolescents or 1 in 10 adults)  Chills, sore joints (about 1 person in 10)  Body aches (about 1 person in 3 or 4)  Rash, swollen glands (uncommon) Moderate problems following Tdap (Interfered with activities, but did not require medical attention)  Pain where the shot was given (up to 1 in 5 or 6)  Redness or swelling where the shot was given (up to about 1 in 16 adolescents or 1 in 12 adults)  Fever over 102F (about 1 in 100 adolescents or 1 in 250 adults)  Headache (about 1 in 7 adolescents or 1 in 10 adults)  Nausea, vomiting, diarrhea, stomach  ache (up to 1 or 3 people in 100)  Swelling of the entire arm where the shot was given (up to about 1 in 500). Severe problems following Tdap (Unable to perform usual activities; required medical attention)  Swelling, severe pain, bleeding and redness in the arm where the shot was given (rare). Problems that could happen after any vaccine:  People sometimes faint after a medical procedure, including vaccination. Sitting or lying down for about 15 minutes can help prevent fainting, and injuries caused by a fall. Tell your doctor if you feel dizzy, or have vision changes or ringing in the ears.  Some people get severe pain in the shoulder and have difficulty moving the arm where a shot was given. This happens very rarely.  Any medication can cause a severe allergic reaction. Such reactions from a vaccine are very rare, estimated at fewer than 1 in a million doses, and would happen within a few minutes to a few hours after the vaccination. As with any medicine, there is a very remote chance of a vaccine causing a serious injury or death. The safety of vaccines is always being monitored. For more information, visit: http://floyd.org/www.cdc.gov/vaccinesafety/ 5. What if there is a serious problem? What should I look for?  Look for anything that concerns you, such as signs of a severe allergic reaction, very high fever, or unusual behavior. Signs of a severe allergic reaction can include hives, swelling of the face and throat, difficulty breathing, a fast heartbeat, dizziness, and weakness. These would usually start a few minutes to a few hours after the vaccination. What should I do?  If you think it is a severe allergic reaction or other emergency that can't wait, call 9-1-1 or get the person to the nearest hospital. Otherwise, call your doctor.  Afterward, the reaction should be reported to the Vaccine Adverse Event Reporting System (VAERS). Your doctor might file this report, or you can do it yourself through  the VAERS web site at www.vaers.LAgents.nohhs.gov, or by calling 1-223-660-1449. VAERS does not give medical advice. 6. The National Vaccine Injury Compensation Program The National Vaccine Injury Compensation Program (VICP) is a federal program that was created to compensate people  who may have been injured by certain vaccines. Persons who believe they may have been injured by a vaccine can learn about the program and about filing a claim by calling 463-349-1058 or visiting the [place] website at GoldCloset.com.ee. There is a time limit to file a claim for compensation. 7. How can I learn more?  Ask your doctor. He or she can give you the vaccine package insert or suggest other sources of information.  Call your local or state health department.  Contact the Centers for Disease Control and Prevention (CDC): ? Call 903-068-8227 (1-800-CDC-INFO) or ? Visit CDC's website at http://hunter.com/ Vaccine Information Statement Tdap Vaccine (06/21/2013) This information is not intended to replace advice given to you by your health care provider. Make sure you discuss any questions you have with your health care provider. Document Released: 10/14/2011 Document Revised: 11/30/2017 Document Reviewed: 11/30/2017 Elsevier Interactive Patient Education  [employer].

## 2018-12-24 LAB — LIPID PANEL WITH LDL/HDL RATIO
Cholesterol, Total: 181 mg/dL (ref 100–199)
HDL: 50 mg/dL (ref >39)
LDL Calculated: 104 mg/dL — ABNORMAL HIGH (ref 0–99)
LDl/HDL Ratio: 2.1 ratio (ref 0.0–3.6)
Triglycerides: 134 mg/dL (ref 0–149)
VLDL Cholesterol Cal: 27 mg/dL (ref 5–40)

## 2018-12-24 LAB — GLUCOSE, RANDOM: Glucose: 105 mg/dL — ABNORMAL HIGH (ref 65–99)

## 2018-12-24 NOTE — Progress Notes (Signed)
Bretlyn, I think he was given sign up for his MyChart account, however he has not yet. You can let him know that his glucose is 105 mildly elevated as normal ranges 65-99. His LDL bad cholesterol is mildly elevated at 104 normal ranges 0-99.  All other levels are normal for his cholesterol. He should follow a low cholesterol, low processed foods diet and increase exercise decrease this level to normal range and prevent cardiovascular disease in the future. Please let me know should you have any questions and we can also send him the cholesterol and glucose information through my chart and he can follow-up with his primary care provider in 1 to 2 months or sooner if needed.

## 2019-09-20 ENCOUNTER — Other Ambulatory Visit: Payer: Self-pay

## 2019-09-20 ENCOUNTER — Other Ambulatory Visit: Payer: Managed Care, Other (non HMO)

## 2020-01-26 ENCOUNTER — Other Ambulatory Visit: Payer: Self-pay

## 2020-01-26 ENCOUNTER — Encounter: Payer: Self-pay | Admitting: Physician Assistant

## 2020-01-26 ENCOUNTER — Ambulatory Visit: Payer: Managed Care, Other (non HMO) | Admitting: Physician Assistant

## 2020-01-26 VITALS — BP 138/74 | HR 80 | Temp 97.6°F | Resp 18 | Ht 68.0 in | Wt 274.0 lb

## 2020-01-26 DIAGNOSIS — Z0000 Encounter for general adult medical examination without abnormal findings: Secondary | ICD-10-CM

## 2020-01-26 DIAGNOSIS — Z008 Encounter for other general examination: Secondary | ICD-10-CM | POA: Diagnosis not present

## 2020-01-26 NOTE — Progress Notes (Signed)
   Subjective:    Patient ID: Marco Palmer, male    DOB: May 28, 1990, 29 y.o.   MRN: [id number]  HPI  29 yo M for Biometrics and brief exam  Diagnosed with Wegener's granulomatosis at age 51 Underwent dialysis Father donated kidney - right -transplant - Well tolerated by patient report Followed by transplant team Cellcept and Tacrolimus Rx  No Covid vaccines done  PCP Dr Geralynn Ochs in [place] reported- though states he hasn't seen him in a number of years   Does NOT  Do q 6 mos dental  hygiene   Has seasonal allergies - uses Xyzal and Flonase with relief  HTN- Lisonopril and Norvasc  Grossly overweight -states he lost from 274 down to 235 and felt much better-- now has gained back to 274 -- Blames Covid restrictions for weight gain   Does no exercise   Review of Systems As noted above   Denies smoking Objective:   Physical Exam Vitals and nursing note reviewed.  Constitutional:      General: He is not in acute distress.    Appearance: He is obese.     Comments: 41.6 BMI   274 pounds    HENT:     Head: Normocephalic and atraumatic.     Right Ear: Tympanic membrane, ear canal and external ear normal.     Left Ear: Tympanic membrane, ear canal and external ear normal.     Ears:     Comments: Cerumen right, non-obscuring    Nose: Nose normal.     Mouth/Throat:     Mouth: Mucous membranes are moist.     Comments: Dental care could use increased attenton  Eyes:     Extraocular Movements: Extraocular movements intact.     Conjunctiva/sclera: Conjunctivae normal.  Cardiovascular:     Rate and Rhythm: Normal rate and regular rhythm.     Pulses: Normal pulses.     Heart sounds: Normal heart sounds.  Pulmonary:     Effort: Pulmonary effort is normal.     Breath sounds: Normal breath sounds.  Abdominal:     General: Bowel sounds are normal.     Tenderness: There is no guarding.     Comments: protuberant ,large panniculus, exam limited No masses identified    Genitourinary:    Comments: Defer- denies concerns Musculoskeletal:        General: Normal range of motion.     Cervical back: Normal range of motion.  Skin:    General: Skin is warm.     Capillary Refill: Capillary refill takes less than 2 seconds.  Neurological:     General: No focal deficit present.     Mental Status: He is alert.  Psychiatric:        Mood and Affect: Mood normal.        Behavior: Behavior normal.       Assessment & Plan:  DIscussed need to strongly consider Covid vaccine Overweight , hypertensive, transplant patient immunospupressed/ Request that he talk with transplant team for opinions and input but to my training he is high risk and should give more consideration to it then he has. Realizes he is overwieight - but not motivated to exercise or lose weight--"face it I love to eat"  Labs will be reported as available

## 2020-01-28 LAB — LIPID PANEL
Chol/HDL Ratio: 4.1 ratio (ref 0.0–5.0)
Cholesterol, Total: 200 mg/dL — ABNORMAL HIGH (ref 100–199)
HDL: 49 mg/dL (ref >39)
LDL Chol Calc (NIH): 131 mg/dL — ABNORMAL HIGH (ref 0–99)
Triglycerides: 110 mg/dL (ref 0–149)
VLDL Cholesterol Cal: 20 mg/dL (ref 5–40)

## 2020-01-28 LAB — GLUCOSE, RANDOM: Glucose: 103 mg/dL — ABNORMAL HIGH (ref 65–99)
